# Patient Record
Sex: Male | Born: 1990 | Race: Black or African American | Hispanic: No | Marital: Single | Smoking: Never smoker
Health system: Southern US, Community
[De-identification: ages and names within clinical notes are randomized; demographics above are authoritative.]

---

## 2017-01-05 ENCOUNTER — Emergency Department (HOSPITAL_BASED_OUTPATIENT_CLINIC_OR_DEPARTMENT_OTHER)
Admission: EM | Admit: 2017-01-05 | Discharge: 2017-01-05 | Disposition: A | Discharge status: Home / Self Care | Payer: Self-pay | Attending: Emergency Medicine | Admitting: Emergency Medicine

## 2017-01-05 ENCOUNTER — Encounter (HOSPITAL_BASED_OUTPATIENT_CLINIC_OR_DEPARTMENT_OTHER): Payer: Self-pay | Admitting: Emergency Medicine

## 2017-01-05 DIAGNOSIS — Z113 Encounter for screening for infections with a predominantly sexual mode of transmission: Secondary | ICD-10-CM | POA: Insufficient documentation

## 2017-01-05 DIAGNOSIS — H109 Unspecified conjunctivitis: Secondary | ICD-10-CM

## 2017-01-05 DIAGNOSIS — H1031 Unspecified acute conjunctivitis, right eye: Secondary | ICD-10-CM | POA: Insufficient documentation

## 2017-01-05 LAB — URINALYSIS, ROUTINE W REFLEX MICROSCOPIC
BILIRUBIN URINE: NEGATIVE
GLUCOSE, UA: NEGATIVE mg/dL
HGB URINE DIPSTICK: NEGATIVE
KETONES UR: 15 mg/dL — AB
LEUKOCYTES UA: NEGATIVE
NITRITE: NEGATIVE
PROTEIN: NEGATIVE mg/dL
SPECIFIC GRAVITY, URINE: 1.029 (ref 1.005–1.030)
pH: 6 (ref 5.0–8.0)

## 2017-01-05 MED ORDER — ERYTHROMYCIN 5 MG/GM OP OINT
TOPICAL_OINTMENT | Freq: Once | OPHTHALMIC | Status: AC
  Administered 2017-01-05: 1 via OPHTHALMIC
  Filled 2017-01-05: qty 3.5

## 2017-01-05 NOTE — ED Triage Notes (Addendum)
patient states that he has had reddened conjunctiva to his right eye x 3 days  - while in triage the patient states " Is it too late to mention that I want to be tested for STD's" - patient denies any symptoms "I just want to be on the safe side"

## 2017-01-05 NOTE — ED Provider Notes (Signed)
[facility] Provider Note   CSN: [id number] Arrival date & time: 01/05/17  1800  By signing my name below, I, Diona Browner, attest that this documentation has been prepared under the direction and in the presence of Oren Barella, Barbara Cower, MD. Electronically Signed: Diona Browner, ED Scribe. 01/05/17. 6:36 PM.  History   Chief Complaint Chief Complaint  Patient presents with  . Eye Problem    HPI Marcus Castillo is a 26 y.o. male who presents to the Emergency Department complaining of unchanged redness to his right eye for the last 3-4 days. Pt reports clear discharge. Endorses irritation. He has tried using eye drops with temporary relief. No recent eye injuries. No hx of allergies. Pt denies visual disturbance, cough, sneezing, rhinorrhea, fever, or any other complaints at this time.   Pt would also like to be tested for STD's. He reports his sexual partner recently had sx and was tested. She hasn't gotten her results back yet. Pt denies any STD sx at this time.   The history is provided by the patient. No language interpreter was used.    History reviewed. No pertinent past medical history.  There are no active problems to display for this patient.   History reviewed. No pertinent surgical history.     Home Medications    Prior to Admission medications   Not on File    Family History No family history on file.  Social History Social History  Substance Use Topics  . Smoking status: Never Smoker  . Smokeless tobacco: Never Used  . Alcohol use Yes     Comment: occ     Allergies   Penicillins   Review of Systems Review of Systems  All other systems reviewed and are negative.  All systems are negative except as noted in the HPI and PMH.   Physical Exam Updated Vital Signs BP 140/85 (BP Location: Right Arm)   Pulse 72   Temp 99 F (37.2 C) (Oral)   Resp 16   Ht 6' (1.829 m)   Wt 87.1 kg (192 lb)   SpO2 100%   BMI 26.04 kg/m   Physical Exam   Constitutional: He is oriented to person, place, and time. He appears well-developed and well-nourished.  HENT:  Head: Normocephalic.  Eyes: Pupils are equal, round, and reactive to light. EOM are normal. Right conjunctiva is injected.  No photophobia. No pain with EOM.   Neck: Normal range of motion.  Pulmonary/Chest: Effort normal.  Abdominal: He exhibits no distension.  Musculoskeletal: Normal range of motion.  Neurological: He is alert and oriented to person, place, and time.  Psychiatric: He has a normal mood and affect.  Nursing note and vitals reviewed.    ED Treatments / Results  DIAGNOSTIC STUDIES: Oxygen Saturation is 100% on RA, normal by my interpretation.   COORDINATION OF CARE: 6:36 PM-Discussed next steps with pt. Pt verbalized understanding and is agreeable with the plan.   Labs (all labs ordered are listed, but only abnormal results are displayed) Labs Reviewed  URINALYSIS, ROUTINE W REFLEX MICROSCOPIC - Abnormal; Notable for the following:       Result Value   Ketones, ur 15 (*)    All other components within normal limits  GC/CHLAMYDIA PROBE AMP ([place]) NOT AT [facility]    EKG  EKG Interpretation None       Radiology No results found.  Procedures Procedures (including critical care time)  Medications Ordered in ED Medications  erythromycin ophthalmic ointment (1 application  Right Eye Given 01/05/17 1903)     Initial Impression / Assessment and Plan / ED Course  I have reviewed the triage vital signs and the nursing notes.  Pertinent labs & imaging results that were available during my care of the patient were reviewed by me and considered in my medical decision making (see chart for details).     We'll check with for STDs even that his symptoms. He does have conjunctivitis we'll prophylactically treat for bacterial causes but it does not appear to be bacterial at this time. No foreign bodies, visual changes or history of trauma to his  eye at suspect iritis.  Final Clinical Impressions(s) / ED Diagnoses   Final diagnoses:  Conjunctivitis of right eye, unspecified conjunctivitis type    New Prescriptions There are no discharge medications for this patient.  I personally performed the services described in this documentation, which was scribed in my presence. The recorded information has been reviewed and is accurate.    Vastie Douty, Barbara Cower, MD 01/06/17 [phone]

## 2017-01-05 NOTE — ED Notes (Signed)
ED Provider at bedside. 

## 2017-01-06 LAB — GC/CHLAMYDIA PROBE AMP (~~LOC~~) NOT AT ARMC
Chlamydia: NEGATIVE
Neisseria Gonorrhea: NEGATIVE

## 2017-01-10 NOTE — ED Notes (Signed)
Pt. Called for STD results .  Reviewed results with pt.  He verbalized understanding

## 2017-10-21 ENCOUNTER — Other Ambulatory Visit: Payer: Self-pay

## 2017-10-21 ENCOUNTER — Encounter (HOSPITAL_BASED_OUTPATIENT_CLINIC_OR_DEPARTMENT_OTHER): Payer: Self-pay | Admitting: *Deleted

## 2017-10-21 ENCOUNTER — Emergency Department (HOSPITAL_BASED_OUTPATIENT_CLINIC_OR_DEPARTMENT_OTHER)
Admission: EM | Admit: 2017-10-21 | Discharge: 2017-10-21 | Disposition: A | Discharge status: Home / Self Care | Payer: Self-pay | Attending: Emergency Medicine | Admitting: Emergency Medicine

## 2017-10-21 DIAGNOSIS — Z113 Encounter for screening for infections with a predominantly sexual mode of transmission: Secondary | ICD-10-CM | POA: Insufficient documentation

## 2017-10-21 DIAGNOSIS — R360 Urethral discharge without blood: Secondary | ICD-10-CM | POA: Insufficient documentation

## 2017-10-21 DIAGNOSIS — R369 Urethral discharge, unspecified: Secondary | ICD-10-CM

## 2017-10-21 LAB — URINALYSIS, MICROSCOPIC (REFLEX)

## 2017-10-21 LAB — URINALYSIS, ROUTINE W REFLEX MICROSCOPIC
Bilirubin Urine: NEGATIVE
Glucose, UA: NEGATIVE mg/dL
Hgb urine dipstick: NEGATIVE
KETONES UR: 15 mg/dL — AB
NITRITE: NEGATIVE
PH: 7.5 (ref 5.0–8.0)
PROTEIN: NEGATIVE mg/dL
Specific Gravity, Urine: 1.025 (ref 1.005–1.030)

## 2017-10-21 LAB — RAPID HIV SCREEN (HIV 1/2 AB+AG)
HIV 1/2 Antibodies: NONREACTIVE
HIV-1 P24 Antigen - HIV24: NONREACTIVE

## 2017-10-21 MED ORDER — CEFTRIAXONE SODIUM 250 MG IJ SOLR
250.0000 mg | Freq: Once | INTRAMUSCULAR | Status: AC
  Administered 2017-10-21: 250 mg via INTRAMUSCULAR
  Filled 2017-10-21: qty 250

## 2017-10-21 MED ORDER — AZITHROMYCIN 250 MG PO TABS
1000.0000 mg | ORAL_TABLET | Freq: Once | ORAL | Status: AC
Start: 2017-10-21 — End: 2017-10-21
  Administered 2017-10-21: 1000 mg via ORAL
  Filled 2017-10-21: qty 4

## 2017-10-21 NOTE — ED Provider Notes (Signed)
[facility] EMERGENCY DEPARTMENT Provider Note   CSN: [id number] Arrival date & time: 10/21/17  1456     History   Chief Complaint Chief Complaint  Patient presents with  . SEXUALLY TRANSMITTED DISEASE    HPI Marcus Levanristan Vo is a 27 y.o. male.  HPI   Marcus Castillo is a 27 year old male with no significant past medical history who presents to the emergency department for evaluation of penile discharge.  Patient reports that he noticed the discharge this morning, it is a gray in appearance.  Denies any other symptoms.  Denies fevers, chills, abdominal pain, nausea/vomiting, painful bowel movements, testicular pain/swelling, flank pain, hematuria, diarrhea.  He reports that he is sexually active with 2 male partners, denies regular condom use.  He would like to be checked for HIV and syphilis today.  History reviewed. No pertinent past medical history.  There are no active problems to display for this patient.   History reviewed. No pertinent surgical history.      Home Medications    Prior to Admission medications   Not on File    Family History No family history on file.  Social History Social History   Tobacco Use  . Smoking status: Never Smoker  . Smokeless tobacco: Never Used  Substance Use Topics  . Alcohol use: Yes    Comment: occ  . Drug use: No     Allergies   Penicillins   Review of Systems Review of Systems  Constitutional: Negative for chills and fever.  Respiratory: Negative for shortness of breath.   Cardiovascular: Negative for chest pain.  Gastrointestinal: Negative for abdominal pain, blood in stool, diarrhea, nausea and vomiting.  Genitourinary: Positive for discharge. Negative for dysuria, frequency, hematuria, penile pain, penile swelling, scrotal swelling and testicular pain.  Musculoskeletal: Negative for back pain.  Skin: Negative for rash and wound.     Physical Exam Updated Vital Signs BP 125/84 (BP Location:  Right Arm)   Pulse 66   Temp 98.6 F (37 C) (Oral)   Resp 16   Ht 6' (1.829 m)   Wt 88 kg (194 lb)   SpO2 99%   BMI 26.31 kg/m   Physical Exam  Constitutional: He appears well-developed and well-nourished. No distress.  No acute distress, non-toxic appearing.   HENT:  Head: Normocephalic and atraumatic.  Eyes: Right eye exhibits no discharge. Left eye exhibits no discharge.  Pulmonary/Chest: Effort normal. No respiratory distress.  Abdominal: Soft. There is no tenderness.  No CVA tenderness.   Genitourinary:  Genitourinary Comments: Chaperone present for exam. Grey discharge from penis. No signs of lesion or erythema on the penis or testicles. The penis and testicles are nontender. No testicular masses or swelling.  Neurological: He is alert. Coordination normal.  Skin: He is not diaphoretic.  Psychiatric: He has a normal mood and affect. His behavior is normal.  Nursing note and vitals reviewed.    ED Treatments / Results  Labs (all labs ordered are listed, but only abnormal results are displayed) Labs Reviewed  URINALYSIS, ROUTINE W REFLEX MICROSCOPIC - Abnormal; Notable for the following components:      Result Value   Ketones, ur 15 (*)    Leukocytes, UA TRACE (*)    All other components within normal limits  URINALYSIS, MICROSCOPIC (REFLEX) - Abnormal; Notable for the following components:   Bacteria, UA FEW (*)    All other components within normal limits  RAPID HIV SCREEN (HIV 1/2 AB+AG)  RPR  GC/CHLAMYDIA PROBE  AMP ([place]) NOT AT [facility]    EKG None  Radiology No results found.  Procedures Procedures (including critical care time)  Medications Ordered in ED Medications  azithromycin (ZITHROMAX) tablet 1,000 mg (1,000 mg Oral Given 10/21/17 1705)  cefTRIAXone (ROCEPHIN) injection 250 mg (250 mg Intramuscular Given 10/21/17 1705)     Initial Impression / Assessment and Plan / ED Course  I have reviewed the triage vital signs and the nursing  notes.  Pertinent labs & imaging results that were available during my care of the patient were reviewed by me and considered in my medical decision making (see chart for details).     Patient is afebrile without abdominal tenderness, abdominal pain or painful bowel movements to indicate prostatitis.  No tenderness to palpation of the testes or epididymis to suggest orchitis or epididymitis.  STD cultures obtained including HIV, syphilis, gonorrhea and chlamydia. Discussed importance of using protection when sexually active. Pt understands that he has GC/Chlamydia cultures pending and that he will need to inform all sexual partners if results return positive. Patient has been treated prophylactically with azithromycin and Rocephin.  Discussed reasons to return to the emergency department and he agrees.  Final Clinical Impressions(s) / ED Diagnoses   Final diagnoses:  Penile discharge    ED Discharge Orders    None       Lawrence Marseilles 10/21/17 1951    Arby Barrette, MD 10/21/17 2340

## 2017-10-21 NOTE — ED Triage Notes (Signed)
Penile discharge x 2 days 

## 2017-10-21 NOTE — Discharge Instructions (Addendum)
You were treated for chlamydia and gonorrhea today.  You are being tested for HIV, syphilis, chlamydia and gonorrhea.  If this testing is positive you will receive a phone call and need to inform all sexual partners.  I have listed the information to the health department below for future STD concerns.  No sex for a week.  It is important to use condoms while having sex.  Return to the emergency department if you have any new or concerning symptoms like stomach pain, nausea/vomiting, testicular pain or swelling, fever.

## 2017-10-22 LAB — GC/CHLAMYDIA PROBE AMP (~~LOC~~) NOT AT ARMC
CHLAMYDIA, DNA PROBE: NEGATIVE
NEISSERIA GONORRHEA: POSITIVE — AB

## 2017-10-22 LAB — RPR: RPR Ser Ql: NONREACTIVE

## 2018-02-09 ENCOUNTER — Emergency Department (HOSPITAL_BASED_OUTPATIENT_CLINIC_OR_DEPARTMENT_OTHER)
Admission: EM | Admit: 2018-02-09 | Discharge: 2018-02-09 | Disposition: A | Discharge status: Home / Self Care | Payer: Self-pay | Attending: Emergency Medicine | Admitting: Emergency Medicine

## 2018-02-09 ENCOUNTER — Encounter (HOSPITAL_BASED_OUTPATIENT_CLINIC_OR_DEPARTMENT_OTHER): Payer: Self-pay

## 2018-02-09 DIAGNOSIS — J069 Acute upper respiratory infection, unspecified: Secondary | ICD-10-CM | POA: Insufficient documentation

## 2018-02-09 NOTE — ED Triage Notes (Signed)
Pt c/o bodyaches, chills, cough and congestion since yesterday

## 2018-02-09 NOTE — ED Provider Notes (Signed)
[facility] EMERGENCY DEPARTMENT Provider Note   CSN: [id number] Arrival date & time: 02/09/18  7829     History   Chief Complaint Chief Complaint  Patient presents with  . Nasal Congestion    HPI Marcus Castillo is a 27 y.o. male.  Patient c/o subjective fever, and nasal congestion and drainage in past 1-2 days. Symptoms acute onset, moderate, persistent. Denies cough or sob. No headaches or sinus pain. Denies nausea, vomiting or diarrhea. No neck pain or stiffness. No known ill contacts.   The history is provided by the patient.    History reviewed. No pertinent past medical history.  There are no active problems to display for this patient.   History reviewed. No pertinent surgical history.      Home Medications    Prior to Admission medications   Not on File    Family History No family history on file.  Social History Social History   Tobacco Use  . Smoking status: Never Smoker  . Smokeless tobacco: Never Used  Substance Use Topics  . Alcohol use: Yes    Comment: occ  . Drug use: No     Allergies   Penicillins   Review of Systems Review of Systems  Constitutional: Positive for fever.  HENT: Positive for congestion, rhinorrhea and sneezing. Negative for sinus pressure and sore throat.   Eyes: Negative for discharge and redness.  Respiratory: Negative for cough and shortness of breath.   Cardiovascular: Negative for chest pain.  Gastrointestinal: Negative for diarrhea and vomiting.  Musculoskeletal: Negative for neck pain and neck stiffness.  Skin: Negative for rash.  Neurological: Negative for headaches.     Physical Exam Updated Vital Signs BP 130/87 (BP Location: Right Arm)   Pulse 74   Temp 98.3 F (36.8 C) (Oral)   Resp 16   Ht 1.829 m (6')   Wt 83.9 kg   SpO2 100%   BMI 25.09 kg/m   Physical Exam  Constitutional: He appears well-developed and well-nourished.  HENT:  Right Ear: External ear normal.  Left Ear:  External ear normal.  Mouth/Throat: Oropharynx is clear and moist.  Nasal congestion.   Eyes: Pupils are equal, round, and reactive to light. Conjunctivae are normal. Right eye exhibits no discharge. Left eye exhibits no discharge.  Neck: Neck supple. No tracheal deviation present.  No stiffness or rigidity  Cardiovascular: Normal rate, regular rhythm, normal heart sounds and intact distal pulses. Exam reveals no gallop and no friction rub.  No murmur heard. Pulmonary/Chest: Effort normal and breath sounds normal. No accessory muscle usage. No respiratory distress.  Abdominal: He exhibits no distension. There is no tenderness.  Musculoskeletal: He exhibits no edema.  Neurological: He is alert.  Skin: Skin is warm and dry. No rash noted.  Psychiatric: He has a normal mood and affect.  Nursing note and vitals reviewed.    ED Treatments / Results  Labs (all labs ordered are listed, but only abnormal results are displayed) Labs Reviewed - No data to display  EKG None  Radiology No results found.  Procedures Procedures (including critical care time)  Medications Ordered in ED Medications - No data to display   Initial Impression / Assessment and Plan / ED Course  I have reviewed the triage vital signs and the nursing notes.  Pertinent labs & imaging results that were available during my care of the patient were reviewed by me and considered in my medical decision making (see chart for details).  Exam  c/w viral uri - discussed dx w pt.   Reviewed nursing notes and prior charts for additional history.   rec meds/otc for symptom relief.  Work note for today provided.     Final Clinical Impressions(s) / ED Diagnoses   Final diagnoses:  None    ED Discharge Orders    None       Cathren LaineSteinl, Mattheu Brodersen, MD 02/09/18 [phone]

## 2018-02-09 NOTE — Discharge Instructions (Signed)
It was our pleasure to provide your ER care today - we hope that you feel better.  Your symptoms are most consistent with a viral uri or cold-like illness that should run its course and resolve in 1 week .  You may try zyrtec-d or claritin-d as need for symptom relief.   Take acetaminophen and/or ibuprofen as need if fever/aches.   Follow up with primary care doctor in 1-2 weeks if symptoms fail to improve/resolve.

## 2019-04-10 ENCOUNTER — Encounter (HOSPITAL_COMMUNITY): Payer: Self-pay | Admitting: Emergency Medicine

## 2019-04-10 ENCOUNTER — Emergency Department (HOSPITAL_COMMUNITY)
Admission: EM | Admit: 2019-04-10 | Discharge: 2019-04-11 | Disposition: A | Discharge status: Home / Self Care | Payer: Self-pay | Attending: Emergency Medicine | Admitting: Emergency Medicine

## 2019-04-10 DIAGNOSIS — F1210 Cannabis abuse, uncomplicated: Secondary | ICD-10-CM | POA: Insufficient documentation

## 2019-04-10 DIAGNOSIS — Y9289 Other specified places as the place of occurrence of the external cause: Secondary | ICD-10-CM | POA: Insufficient documentation

## 2019-04-10 DIAGNOSIS — Y999 Unspecified external cause status: Secondary | ICD-10-CM | POA: Insufficient documentation

## 2019-04-10 DIAGNOSIS — W3400XA Accidental discharge from unspecified firearms or gun, initial encounter: Secondary | ICD-10-CM

## 2019-04-10 DIAGNOSIS — Y939 Activity, unspecified: Secondary | ICD-10-CM | POA: Insufficient documentation

## 2019-04-10 DIAGNOSIS — S92002B Unspecified fracture of left calcaneus, initial encounter for open fracture: Secondary | ICD-10-CM | POA: Insufficient documentation

## 2019-04-10 DIAGNOSIS — S62317A Displaced fracture of base of fifth metacarpal bone. left hand, initial encounter for closed fracture: Secondary | ICD-10-CM | POA: Insufficient documentation

## 2019-04-10 MED ORDER — HYDROMORPHONE HCL 1 MG/ML IJ SOLN
1.0000 mg | Freq: Once | INTRAMUSCULAR | Status: AC
  Administered 2019-04-11: 1 mg via INTRAVENOUS
  Filled 2019-04-10: qty 1

## 2019-04-10 MED ORDER — CLINDAMYCIN PHOSPHATE 600 MG/50ML IV SOLN
600.0000 mg | Freq: Once | INTRAVENOUS | Status: AC
  Administered 2019-04-11: 01:00:00 600 mg via INTRAVENOUS
  Filled 2019-04-10: qty 50

## 2019-04-10 MED ORDER — GENTAMICIN SULFATE 40 MG/ML IJ SOLN
130.0000 mg | Freq: Once | INTRAVENOUS | Status: AC
  Administered 2019-04-11: 02:00:00 130 mg via INTRAVENOUS
  Filled 2019-04-10: qty 3.25
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
## 2019-04-11 ENCOUNTER — Emergency Department (HOSPITAL_COMMUNITY): Payer: Self-pay

## 2019-04-11 LAB — BASIC METABOLIC PANEL
Anion gap: 10 (ref 5–15)
BUN: 12 mg/dL (ref 6–20)
CO2: 22 mmol/L (ref 22–32)
Calcium: 9 mg/dL (ref 8.9–10.3)
Chloride: 105 mmol/L (ref 98–111)
Creatinine, Ser: 1.2 mg/dL (ref 0.61–1.24)
GFR calc Af Amer: >60 mL/min (ref >60)
GFR calc non Af Amer: >60 mL/min (ref >60)
Glucose, Bld: 98 mg/dL (ref 70–99)
Potassium: 3.6 mmol/L (ref 3.5–5.1)
Sodium: 137 mmol/L (ref 135–145)

## 2019-04-11 LAB — CBC WITH DIFFERENTIAL/PLATELET
Abs Immature Granulocytes: 0.05 10*3/uL (ref 0.00–0.07)
Basophils Absolute: 0.1 10*3/uL (ref 0.0–0.1)
Basophils Relative: 1 %
Eosinophils Absolute: 0.3 10*3/uL (ref 0.0–0.5)
Eosinophils Relative: 2 %
HCT: 46.7 % (ref 39.0–52.0)
Hemoglobin: 15.6 g/dL (ref 13.0–17.0)
Immature Granulocytes: 0 %
Lymphocytes Relative: 38 %
Lymphs Abs: 4.6 10*3/uL — ABNORMAL HIGH (ref 0.7–4.0)
MCH: 29.4 pg (ref 26.0–34.0)
MCHC: 33.4 g/dL (ref 30.0–36.0)
MCV: 88.1 fL (ref 80.0–100.0)
Monocytes Absolute: 0.9 10*3/uL (ref 0.1–1.0)
Monocytes Relative: 7 %
Neutro Abs: 6.2 10*3/uL (ref 1.7–7.7)
Neutrophils Relative %: 52 %
Platelets: 244 10*3/uL (ref 150–400)
RBC: 5.3 MIL/uL (ref 4.22–5.81)
RDW: 12.9 % (ref 11.5–15.5)
WBC: 12 10*3/uL — ABNORMAL HIGH (ref 4.0–10.5)
nRBC: 0 % (ref 0.0–0.2)

## 2019-04-11 MED ORDER — HYDROCODONE-ACETAMINOPHEN 5-325 MG PO TABS: 1.0000 | ORAL_TABLET | Freq: Four times a day (QID) | ORAL | 0 refills | Status: AC | PRN

## 2019-04-11 NOTE — ED Notes (Signed)
Pt verbalized understanding of d/c instructions and follow up care. Also discussed s/s requiring return to ed. Pt had no futher questions at this time
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
## 2019-04-11 NOTE — Discharge Instructions (Addendum)
Please use crutches and the boot as needed.  Please take pain medication as directed.  Please follow-up with the doctors listed above.  If you have fever, redness, or increased pain at either location return to the emergency department.

## 2019-04-11 NOTE — ED Notes (Signed)
Called Ortho tech for cam walker and ulnar slpint
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
## 2019-04-11 NOTE — Progress Notes (Signed)
Orthopedic Tech Progress Note Patient Details:  Marcus Castillo 05-05-1991 [id number]  Ortho Devices Type of Ortho Device: Crutches, CAM walker, Ulna gutter splint Ortho Device/Splint Location: lle cam walker, lue ulna gutter. Ortho Device/Splint Interventions: Ordered, Application, Adjustment   Post Interventions Patient Tolerated: Well Instructions Provided: Care of device, Adjustment of device   Karolee Stamps 04/11/2019, 5:32 AM

## 2019-04-14 ENCOUNTER — Emergency Department (HOSPITAL_COMMUNITY)
Admission: EM | Admit: 2019-04-14 | Discharge: 2019-04-14 | Disposition: A | Discharge status: Home / Self Care | Payer: Self-pay | Attending: Emergency Medicine | Admitting: Emergency Medicine

## 2019-04-14 ENCOUNTER — Other Ambulatory Visit: Payer: Self-pay

## 2019-04-14 ENCOUNTER — Encounter (HOSPITAL_COMMUNITY): Payer: Self-pay | Admitting: Emergency Medicine

## 2019-04-14 DIAGNOSIS — Z760 Encounter for issue of repeat prescription: Secondary | ICD-10-CM | POA: Insufficient documentation

## 2019-04-14 DIAGNOSIS — F1210 Cannabis abuse, uncomplicated: Secondary | ICD-10-CM | POA: Insufficient documentation

## 2019-04-14 DIAGNOSIS — W3400XA Accidental discharge from unspecified firearms or gun, initial encounter: Secondary | ICD-10-CM | POA: Insufficient documentation

## 2019-04-14 MED ORDER — HYDROCODONE-ACETAMINOPHEN 5-325 MG PO TABS: 1.0000 | ORAL_TABLET | Freq: Four times a day (QID) | ORAL | 0 refills | Status: AC | PRN
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
## 2019-04-14 NOTE — Discharge Instructions (Addendum)
As discussed, I recommend taking over the counter ibuprofen for pain and saving the Norcos for severe pain. If you run out of pain medications, call the orthopedic office and speak to them to see if they can get you some more since your appointment was rescheduled. Return to the ER for new or worsening symptoms.

## 2020-08-13 IMAGING — CR DG WRIST COMPLETE 3+V*L*
4 series · 4 of 4 positions shown · non-contrast
Comparison: None.

CLINICAL DATA: Left hand pain from fall

EXAM:
LEFT WRIST - COMPLETE 3+ VIEW

[wrist pa]
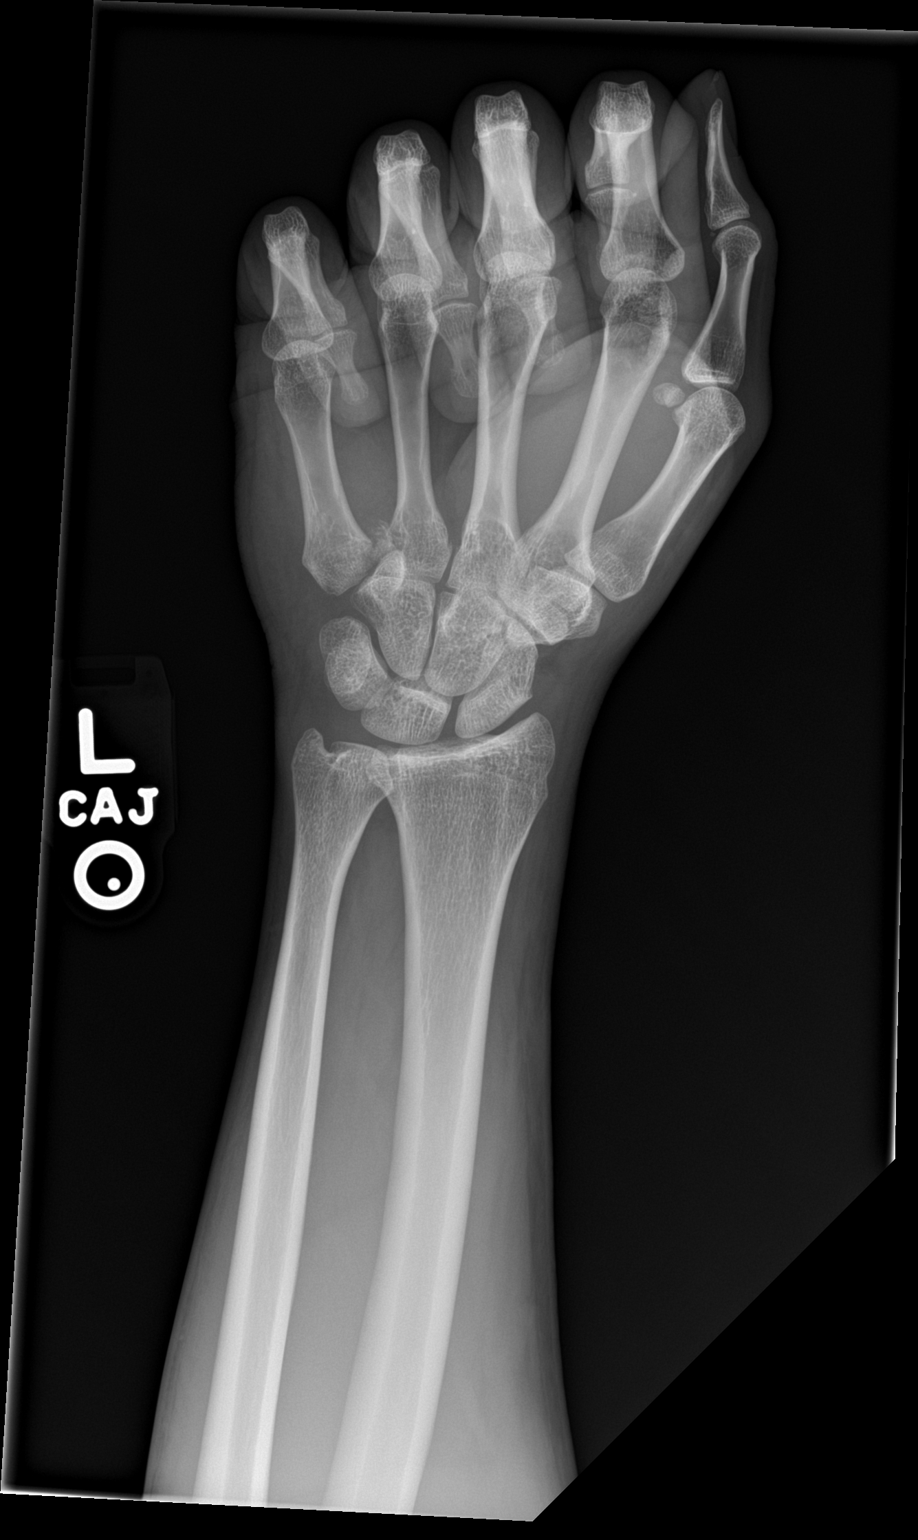

[wrist obl]
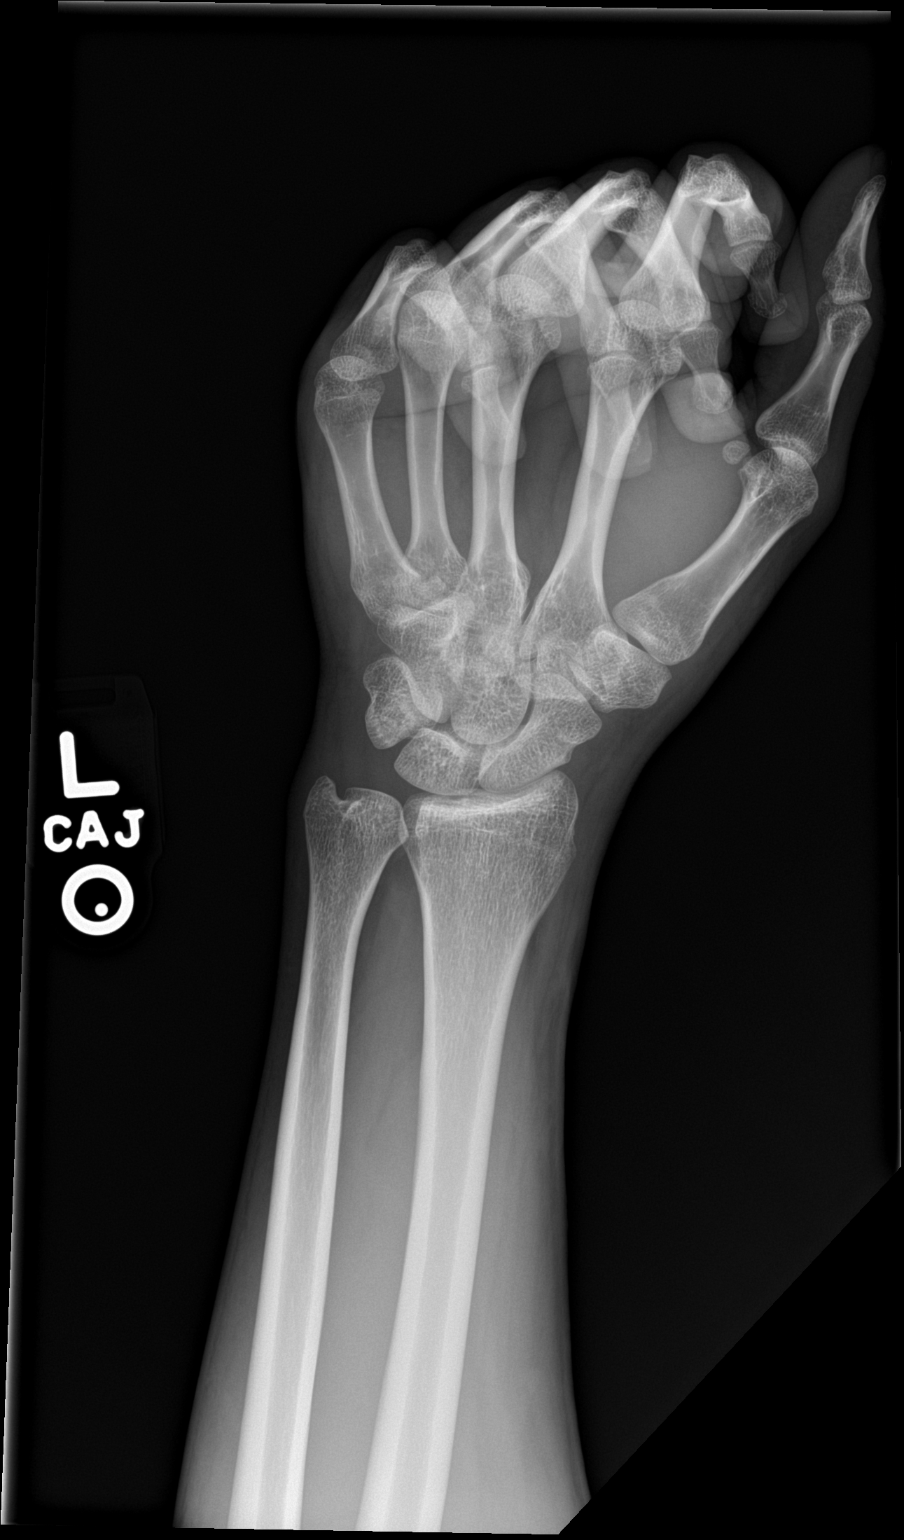

[wrist lat]
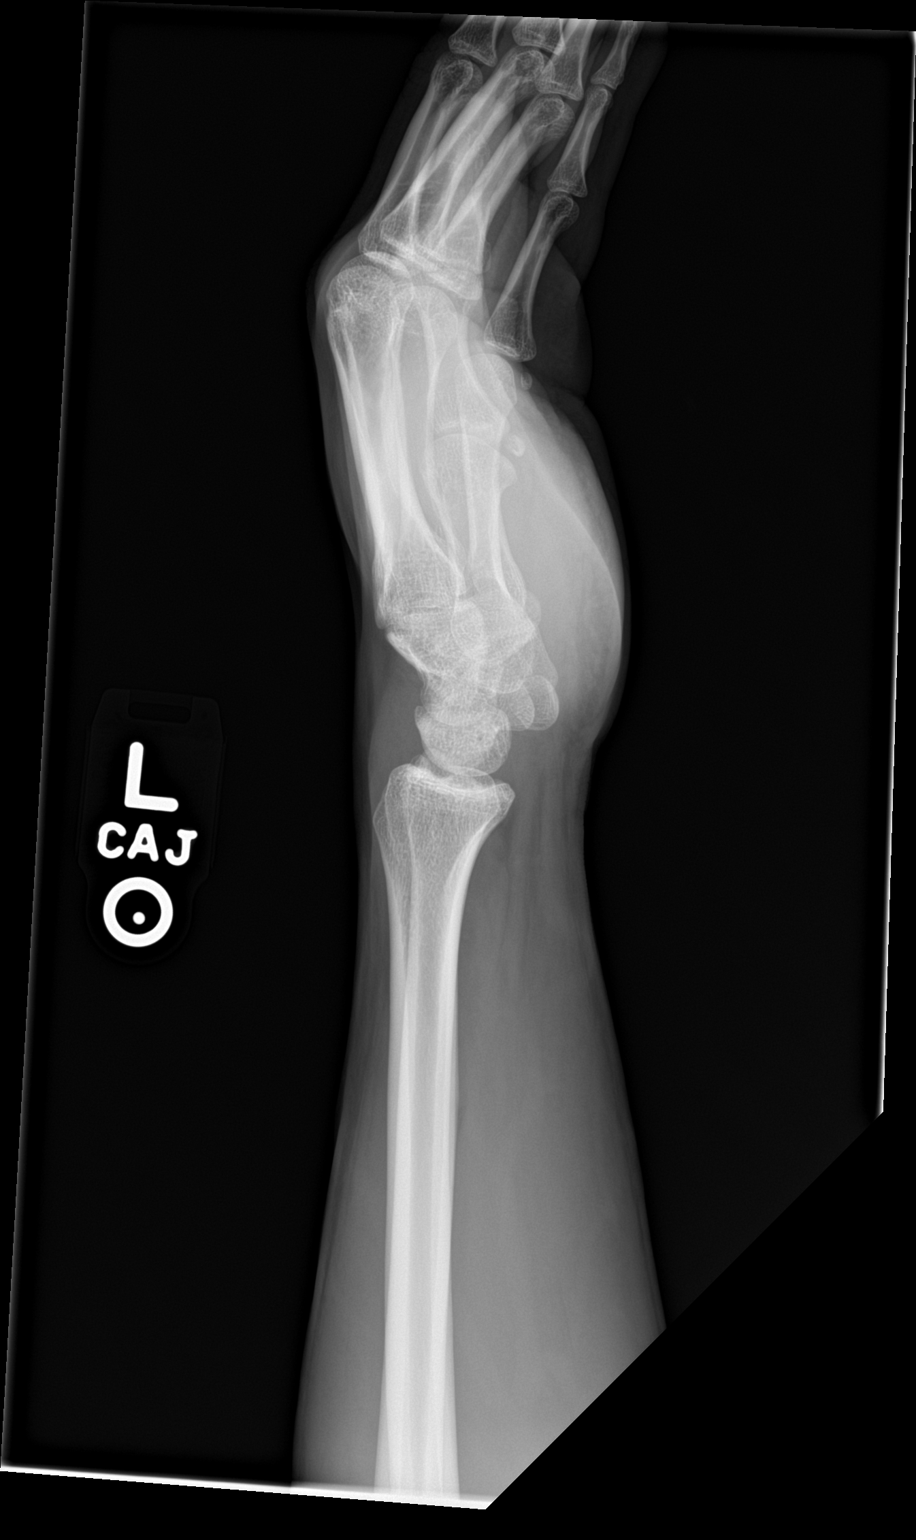

[wrist navicular]
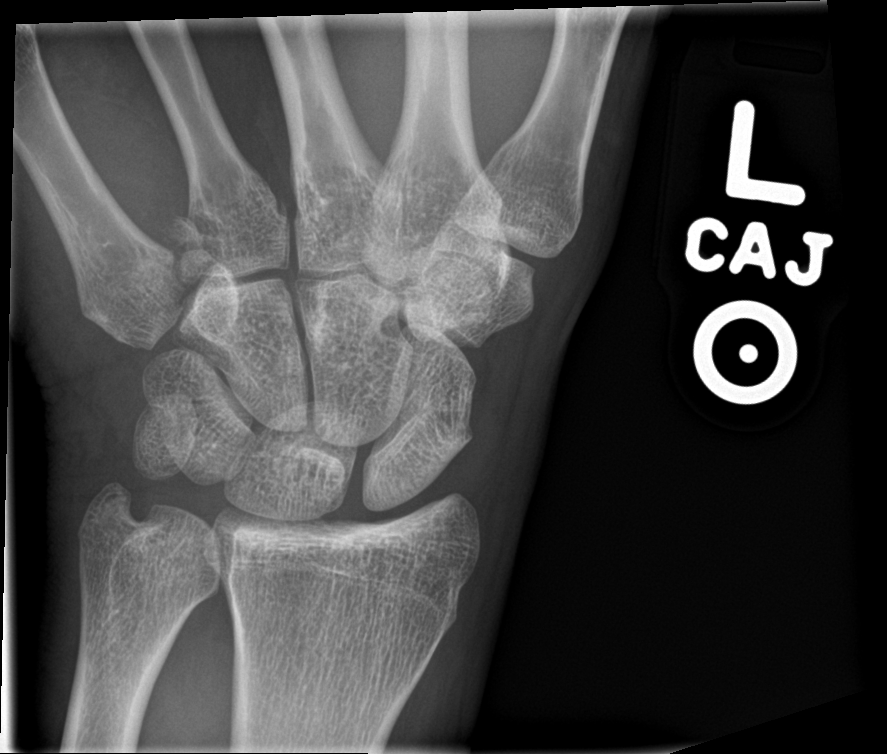

[4 of 4 positions shown; findings below may reference images not displayed]

FINDINGS: Acute intra-articular fracture involving the radial base of the
fifth metacarpal with mild displacement. Soft tissue swelling is
present.
IMPRESSION: Acute mildly displaced intra-articular fracture at the base of the
fifth metacarpal. Mildly displaced acute intra-articular fracture
involving the base of the fifth metacarpal
